# Patient Record
Sex: Female | Born: 1955 | Race: Black or African American | Hispanic: No | Marital: Married | Smoking: Never smoker
Health system: Southern US, Community
[De-identification: ages and names within clinical notes are randomized; demographics above are authoritative.]

## PROBLEM LIST (undated history)

## (undated) DIAGNOSIS — N62 Hypertrophy of breast: Secondary | ICD-10-CM

## (undated) DIAGNOSIS — E785 Hyperlipidemia, unspecified: Secondary | ICD-10-CM

## (undated) HISTORY — PX: BUNIONECTOMY: SHX129

## (undated) HISTORY — PX: KNEE SURGERY: SHX244

## (undated) HISTORY — PX: ABDOMINAL HYSTERECTOMY: SHX81

---

## 2008-10-04 ENCOUNTER — Encounter: Payer: Self-pay | Admitting: Obstetrics and Gynecology

## 2008-10-04 ENCOUNTER — Inpatient Hospital Stay (HOSPITAL_COMMUNITY): Admission: RE | Admit: 2008-10-04 | Discharge: 2008-10-05 | Payer: Self-pay | Admitting: Obstetrics and Gynecology

## 2010-02-19 ENCOUNTER — Encounter
Admission: RE | Admit: 2010-02-19 | Discharge: 2010-02-19 | Payer: Self-pay | Source: Home / Self Care | Attending: Family Medicine | Admitting: Family Medicine

## 2010-05-31 LAB — CBC
HCT: 30.4 % — ABNORMAL LOW (ref 36.0–46.0)
Hemoglobin: 9.8 g/dL — ABNORMAL LOW (ref 12.0–15.0)
MCHC: 32.5 g/dL (ref 30.0–36.0)
Platelets: 275 10*3/uL (ref 150–400)
RBC: 3.79 MIL/uL — ABNORMAL LOW (ref 3.87–5.11)
RDW: 17 % — ABNORMAL HIGH (ref 11.5–15.5)
WBC: 5.9 10*3/uL (ref 4.0–10.5)
WBC: 9.3 10*3/uL (ref 4.0–10.5)

## 2010-07-07 NOTE — Discharge Summary (Signed)
[name], Sheila Wilkinson                ACCOUNT NO.:  [id number]   MEDICAL RECORD NO.:  [id number]          PATIENT TYPE:  INP   LOCATION:  9305                          FACILITY:  [facility]   PHYSICIAN:  Arlyce Harman, MD     DATE OF BIRTH:  1956/02/06   DATE OF ADMISSION:  10/04/2008  DATE OF DISCHARGE:  10/05/2008                               DISCHARGE SUMMARY   REASON FOR HOSPITALIZATION:  This 55 year old parous female was admitted  for hysterectomy.   DIAGNOSES:  Menorrhagia, dysmenorrhea, anemia, and enlarged uterus with  fibroid growth.   Options for therapy have been thoroughly discussed with the patient and  conservative methods have been performed without relief.  Therefore,  hysterectomy was requested.  Informed consent was given and she  underwent a total vaginal hysterectomy on October 04, 2008.  Postoperative course has been uncomplicated, and she is being discharged  today in excellent condition.  Pathology report is pending at the time  of discharge.   LABORATORY DATA:  On October 05, 2008, her hemoglobin is 8.9 with  hematocrit of 27, WBC is 9.3, and platelet count is normal.   FINAL DIAGNOSES:  Menorrhagia, dysmenorrhea, fibroid tumors, and anemia.   SIGNIFICANT FINDINGS:  None.   PROCEDURES PERFORMED:  A total vaginal hysterectomy with morcellation.  Condition is excellent.  The patient is ambulatory and voiding without  difficulty.   DISCHARGE INSTRUCTIONS:  Instructions given to the patient are complete  rest over the next 2-3 weeks with ambulation inside the house.  She will  call if she has a fever or excessive pain or bleeding.  No sex for 6  weeks.  No heavy lifting for 6 weeks.   MEDICATIONS:  She has iron at home, she will continue to take 1 a day  and a multivitamin 1 a day.  A prescription is given for Naprosyn 500  mg, she is to take 1 tablet every 8 hours as needed for pain.  She is to  use over-the-counter stool softener everyday for the next week.   She is  to have a regular diet.      Arlyce Harman, MD  Electronically Signed     EG/MEDQ  D:  10/05/2008  T:  10/05/2008  Job:  [id number]

## 2010-07-07 NOTE — Op Note (Signed)
NAMESHAVONN, CONVEY                ACCOUNT NO.:  [id number]   MEDICAL RECORD NO.:  [id number]          PATIENT TYPE:  INP   LOCATION:  9305                          FACILITY:  [facility]   PHYSICIAN:  Arlyce Harman, MD     DATE OF BIRTH:  1955/03/19   DATE OF PROCEDURE:  10/04/2008  DATE OF DISCHARGE:                               OPERATIVE REPORT   PREOPERATIVE DIAGNOSES:  Enlarged uterus, fibroids, menorrhagia, anemia.   POSTOPERATIVE DIAGNOSES:  Enlarged uterus, fibroids, menorrhagia,  anemia.   SURGEON:  Arlyce Harman, MD   ASSISTANT:  None.   COMPLICATIONS:  None.   ESTIMATED BLOOD LOSS:  200 mL.   PROCEDURE:  Total vaginal hysterectomy with morcellation due to enlarged  uterus.   INDICATIONS AND CONSENT:  This patient is 55 years old, has had  worsening menorrhagia and dysmenorrhea and anemia and has not resolved  with conservative methods, and has requested hysterectomy.  The patient  is aware that she may have to have an abdominal hysterectomy due to her  enlarged uterus, but we will attempt vaginal hysterectomy.  Risks,  possible complications of the procedure has been explained and informed  consent was given.   Findings  enlarged enlarged   PROCEDURE IN DETAIL:  The patient was placed on the table in supine  position.  General anesthesia was induced.  She was then placed in the  dorsolithotomy position and a Foley was placed in the bladder.  Exam  under anesthesia revealed an enlarged uterus approximately 10 weeks size  and the adnexa was free and cervix is mobilized.  Therefore, we felt  that we could proceed with vaginal hysterectomy.  The cervix was entered  and lip of the cervix was grasped, and the posterior lip of the cervix  was grasped with Lahey tenaculum.  An approximately 6 mL solution of Neo-  Synephrine was injected paracervically.  The cervix was then incised and  the epithelium reflected upward.  Posteriorly, we entered the cul-de-sac  and then a  right angle retractor was placed in the cul-de-sac and the  uterosacral lips were clamped, cut, and tied, all ties with 1-0 Vicryl  suture.  These were done bilaterally and then we went anteriorly and  reflected the epithelium up and identified the peritoneum.  A tag was  placed on that and we continued to work away around laterally.  The  bladder pillows were clamped, cut and tied bilaterally and the bladder  was reflected upward.  Then, the cardinal ligaments were clamped, cut,  and tied bilaterally.  The uterine vessels were clamped, cut, and tied  bilaterally.   At this point, we tried to deliver this uterus, but could not due to a  large fundal fibroid posteriorly.  Therefore, we had to morcellate the  uterus and take it out in pieces until we were able to get the fundus  delivered.  The adnexal pedicles were clamped, cut, and tied  bilaterally.  These were doubly tied and released.  The uterus and  cervix were removed in total and handed off.  The pedicles were checked  and were dried.  Lateral stitches were placed in the cuff.  In the end  then, the peritoneal stitch was placed.  A McCall stitch was placed in  the posterior cul-de-sac.  At this point, the lateral stitches were tied  down bilaterally and cut.  The peritoneum was then tied down, and McCall  stitch was tied down in layers.  Hemostasis was excellent.  Therefore,  the vaginal cuff was closed using 1 Vicryl running stitch.  Hemostasis  was excellent.  The procedure was terminated.  The patient was awakened  and taken to recovery in good condition.    Specimen: enlarged uterus and cervix, uterus morcellated.   Specimen sent to pathology.      Arlyce Harman, MD  Electronically Signed     EG/MEDQ  D:  10/04/2008  T:  10/05/2008  Job:  [id number]

## 2013-10-06 ENCOUNTER — Encounter: Payer: Self-pay | Admitting: Emergency Medicine

## 2013-10-06 ENCOUNTER — Emergency Department (INDEPENDENT_AMBULATORY_CARE_PROVIDER_SITE_OTHER)
Admission: EM | Admit: 2013-10-06 | Discharge: 2013-10-06 | Disposition: A | Discharge status: Home / Self Care | Payer: BC Managed Care – PPO | Source: Home / Self Care | Attending: Family Medicine | Admitting: Family Medicine

## 2013-10-06 DIAGNOSIS — S0502XA Injury of conjunctiva and corneal abrasion without foreign body, left eye, initial encounter: Secondary | ICD-10-CM

## 2013-10-06 DIAGNOSIS — S058X9A Other injuries of unspecified eye and orbit, initial encounter: Secondary | ICD-10-CM

## 2013-10-06 HISTORY — DX: Hyperlipidemia, unspecified: E78.5

## 2013-10-06 MED ORDER — SULFACETAMIDE SODIUM 10 % OP SOLN: OPHTHALMIC | Status: DC

## 2013-10-06 MED ORDER — ERYTHROMYCIN 5 MG/GM OP OINT: TOPICAL_OINTMENT | OPHTHALMIC | Status: DC

## 2013-10-06 NOTE — ED Notes (Signed)
L eye scratchy when pt awoke this AM.  Worsening as day progresses.  Now painful.  Feels like sand in eye.

## 2013-10-06 NOTE — ED Provider Notes (Signed)
CSN: [id number]     Arrival date & time 10/06/13  1707 History   First MD Initiated Contact with Patient 10/06/13 1800     Chief Complaint  Patient presents with  . Eye Pain    Left      HPI Comments: Patient awoke this morning with a foreign body sensation and mild photophobia in her left eye.  She rinsed her eye but symptoms persisted.  Patient is a 58 y.o. female presenting with eye pain. The history is provided by the patient.  Eye Pain This is a new problem. Episode onset: this morning. The problem occurs constantly. The problem has not changed since onset.Pertinent negatives include no headaches. Exacerbated by: blinking. Nothing relieves the symptoms. Treatments tried: eye lavage. The treatment provided no relief.    Past Medical History  Diagnosis Date  . Hyperlipidemia    Past Surgical History  Procedure Laterality Date  . Abdominal hysterectomy    . Cesarean section    . Knee surgery     Family History  Problem Relation Age of Onset  . Hypertension Mother   . Diabetes Father    History  Substance Use Topics  . Smoking status: Never Smoker   . Smokeless tobacco: Never Used  . Alcohol Use: Yes     Comment: 3-5   OB History   Grav Para Term Preterm Abortions TAB SAB Ect Mult Living                 Review of Systems  Eyes: Positive for pain.  Neurological: Negative for headaches.  All other systems reviewed and are negative.   Allergies  Review of patient's allergies indicates no known allergies.  Home Medications   Prior to Admission medications   Medication Sig Start Date End Date Taking? Authorizing Provider  erythromycin ophthalmic ointment Place a 1/2 inch ribbon of ointment into the lower eyelid q3 to 4hr 10/06/13   Lattie HawStephen A Zabdi Mis, MD   BP 120/80  Pulse 65  Temp(Src) 98.4 F (36.9 C) (Oral)  Ht 5\' 5"  (1.651 m)  Wt 163 lb 12 oz (74.277 kg)  BMI 27.25 kg/m2  SpO2 98% Physical Exam  Nursing note and vitals reviewed. Constitutional: She  appears well-developed and well-nourished. No distress.  HENT:  Head: Atraumatic.  Right Ear: External ear normal.  Left Ear: External ear normal.  Nose: Nose normal.  Mouth/Throat: Oropharynx is clear and moist.  Eyes: EOM and lids are normal. Pupils are equal, round, and reactive to light. Lids are everted and swept, no foreign bodies found. Left eye exhibits no chemosis, no discharge, no exudate and no hordeolum. No foreign body present in the left eye. Left conjunctiva is injected. Left conjunctiva has no hemorrhage.    Mild left photophobia present.  Fundi negative.  Fluorescein to left eye reveals 2mm diameter superficial abrasion as noted on diagram.      ED Course  Procedures  none     MDM   1. Left corneal abrasion, initial encounter    Begin erythromycin ophthalmic ointment HS; sulfacetamide ophthalmic suspension daytime. Followup with ophthalmologist if not improved 3 days.    Lattie HawStephen A Viveca Beckstrom, MD 10/07/13 [phone]

## 2013-10-06 NOTE — Discharge Instructions (Signed)
Corneal Abrasion °The cornea is the clear covering at the front and center of the eye. When looking at the colored portion of the eye (iris), you are looking through the cornea. This very thin tissue is made up of many layers. The surface layer is a single layer of cells (corneal epithelium) and is one of the most sensitive tissues in the body. If a scratch or injury causes the corneal epithelium to come off, it is called a corneal abrasion. If the injury extends to the tissues below the epithelium, the condition is called a corneal ulcer. °CAUSES  °· Scratches. °· Trauma. °· Foreign body in the eye. °Some people have recurrences of abrasions in the area of the original injury even after it has healed (recurrent erosion syndrome). Recurrent erosion syndrome generally improves and goes away with time. °SYMPTOMS  °· Eye pain. °· Difficulty or inability to keep the injured eye open. °· The eye becomes very sensitive to light. °· Recurrent erosions tend to happen suddenly, first thing in the morning, usually after waking up and opening the eye. °DIAGNOSIS  °Your health care provider can diagnose a corneal abrasion during an eye exam. Dye is usually placed in the eye using a drop or a small paper strip moistened by your tears. When the eye is examined with a special light, the abrasion shows up clearly because of the dye. °TREATMENT  °· Small abrasions may be treated with antibiotic drops or ointment alone. °If the abrasion becomes infected and spreads to the deeper tissues of the cornea, a corneal ulcer can result. This is serious because it can cause corneal scarring. Corneal scars interfere with light passing through the cornea and cause a loss of vision in the involved eye. °HOME CARE INSTRUCTIONS °· Use medicine or ointment as directed. Only take over-the-counter or prescription medicines for pain, discomfort, or fever as directed by your health care provider. °· Do not drive or operate machinery if your eye is  patched. Your ability to judge distances is impaired. °· If your health care provider has given you a follow-up appointment, it is very important to keep that appointment. Not keeping the appointment could result in a severe eye infection or permanent loss of vision. If there is any problem keeping the appointment, let your health care provider know. °SEEK MEDICAL CARE IF:  °· You have pain, light sensitivity, and a scratchy feeling in one eye or both eyes. °· Any kind of discharge develops from the eye after treatment or if the lids stick together in the morning. °· You have the same symptoms in the morning as you did with the original abrasion days, weeks, or months after the abrasion healed. °MAKE SURE YOU:  °· Understand these instructions. °· Will watch your condition. °· Will get help right away if you are not doing well or get worse. °Document Released: 02/06/2000 Document Revised: 02/13/2013 Document Reviewed: 10/16/2012 °[employer]® Patient Information ©2015 [employer]. This information is not intended to replace advice given to you by your health care provider. Make sure you discuss any questions you have with your health care provider. ° °

## 2017-04-20 MED FILL — HYDROCODON-APAP 5-325: 5-325 | 3 days supply | Qty: 30 | Fill #0

## 2017-05-27 ENCOUNTER — Other Ambulatory Visit: Payer: Self-pay

## 2017-05-27 ENCOUNTER — Emergency Department (HOSPITAL_BASED_OUTPATIENT_CLINIC_OR_DEPARTMENT_OTHER)
Admission: EM | Admit: 2017-05-27 | Discharge: 2017-05-27 | Disposition: A | Discharge status: Home / Self Care | Payer: BLUE CROSS/BLUE SHIELD | Attending: Emergency Medicine | Admitting: Emergency Medicine

## 2017-05-27 ENCOUNTER — Emergency Department (HOSPITAL_BASED_OUTPATIENT_CLINIC_OR_DEPARTMENT_OTHER): Payer: BLUE CROSS/BLUE SHIELD

## 2017-05-27 ENCOUNTER — Encounter (HOSPITAL_BASED_OUTPATIENT_CLINIC_OR_DEPARTMENT_OTHER): Payer: Self-pay | Admitting: *Deleted

## 2017-05-27 DIAGNOSIS — R519 Headache, unspecified: Secondary | ICD-10-CM

## 2017-05-27 DIAGNOSIS — G501 Atypical facial pain: Secondary | ICD-10-CM | POA: Diagnosis not present

## 2017-05-27 DIAGNOSIS — R51 Headache: Secondary | ICD-10-CM

## 2017-05-27 DIAGNOSIS — R6884 Jaw pain: Secondary | ICD-10-CM | POA: Diagnosis present

## 2017-05-27 LAB — BASIC METABOLIC PANEL
Anion gap: 9 (ref 5–15)
BUN: 12 mg/dL (ref 6–20)
CALCIUM: 9.2 mg/dL (ref 8.9–10.3)
CHLORIDE: 106 mmol/L (ref 101–111)
CO2: 25 mmol/L (ref 22–32)
Creatinine, Ser: 0.71 mg/dL (ref 0.44–1.00)
GFR calc Af Amer: >60 mL/min (ref >60)
GFR calc non Af Amer: >60 mL/min (ref >60)
GLUCOSE: 102 mg/dL — AB (ref 65–99)
POTASSIUM: 3.5 mmol/L (ref 3.5–5.1)
Sodium: 140 mmol/L (ref 135–145)

## 2017-05-27 LAB — TROPONIN I: Troponin I: <0.03 ng/mL (ref <0.03)

## 2017-05-27 LAB — CBC
HEMATOCRIT: 37.7 % (ref 36.0–46.0)
HEMOGLOBIN: 12.1 g/dL (ref 12.0–15.0)
MCH: 29.5 pg (ref 26.0–34.0)
MCHC: 32.1 g/dL (ref 30.0–36.0)
MCV: 92 fL (ref 78.0–100.0)
Platelets: 207 10*3/uL (ref 150–400)
RBC: 4.1 MIL/uL (ref 3.87–5.11)
RDW: 13.8 % (ref 11.5–15.5)
WBC: 5.7 10*3/uL (ref 4.0–10.5)

## 2017-05-27 NOTE — ED Provider Notes (Signed)
[facility] EMERGENCY DEPARTMENT Provider Note   CSN: [id number] Arrival date & time: 05/27/17  1230     History   Chief Complaint Chief Complaint  Patient presents with  . Neck Pain  . Jaw Pain    HPI Sheila Wilkinson is a 62 y.o. female.  Patient with history of hyperlipidemia presents today with complaint of intermittent right-sided jaw pain with movement into the right shoulder.  Patient states the last 2 nights she developed the symptoms before going to bed.  She took ibuprofen which helped her symptoms and she was able to sleep well.  This morning at about 10 AM while patient was at work, she developed right sided jaw pain and shoulder pain.  This resolved by arrival but she still states she has some soreness in this area.  She had no associated shortness of breath, diaphoresis, nausea or vomiting.  Patient does not exercise regularly but is active and has never had pains or symptoms like this provoked with exercise.  She denies any facial swelling.  She states that the pain felt like a throbbing associated with her teeth on the upper jaw on the right.  She does not think that she has a toothache however.  She is concerned about having a heart attack.  She denies history of hypertension, diabetes, smoking.  Mother had a heart attack in her late 5360s.  No lower extremity swelling.  Recent foot surgery but patient has been ambulatory.  No history of blood clots.     Past Medical History:  Diagnosis Date  . Hyperlipidemia     There are no active problems to display for this patient.   Past Surgical History:  Procedure Laterality Date  . ABDOMINAL HYSTERECTOMY    . BUNIONECTOMY    . CESAREAN SECTION    . KNEE SURGERY       OB History   None      Home Medications    Prior to Admission medications   Medication Sig Start Date End Date Taking? Authorizing Provider  erythromycin ophthalmic ointment Place a 1/2 inch ribbon of ointment into the lower eyelid q3 to  4hr 10/06/13   Lattie HawBeese, Stephen A, MD  sulfacetamide (BLEPH-10) 10 % ophthalmic solution Place 1 to 2 drops in the left eye every 2 to 3 hours daytime. 10/06/13   Lattie HawBeese, Stephen A, MD    Family History Family History  Problem Relation Age of Onset  . Hypertension Mother   . Diabetes Father     Social History Social History   Tobacco Use  . Smoking status: Never Smoker  . Smokeless tobacco: Never Used  Substance Use Topics  . Alcohol use: Yes    Comment: 3-5  . Drug use: No     Allergies   Patient has no known allergies.   Review of Systems Review of Systems  Constitutional: Negative for diaphoresis and fever.  HENT:       Facial pain  Eyes: Negative for redness.  Respiratory: Negative for cough and shortness of breath.   Cardiovascular: Negative for chest pain, palpitations and leg swelling.  Gastrointestinal: Negative for abdominal pain, nausea and vomiting.  Genitourinary: Negative for dysuria.  Musculoskeletal: Positive for myalgias (R shoulder). Negative for back pain and neck pain.  Skin: Negative for rash.  Neurological: Negative for syncope and light-headedness.  Psychiatric/Behavioral: The patient is not nervous/anxious.      Physical Exam Updated Vital Signs BP (!) 147/77   Pulse 61  Temp 98.6 F (37 C) (Oral)   Resp 18   Ht 5' 4.5" (1.638 m)   Wt 78.5 kg (173 lb)   SpO2 100%   BMI 29.24 kg/m   Physical Exam  Constitutional: She appears well-developed and well-nourished.  HENT:  Head: Normocephalic and atraumatic.  Mouth/Throat: Mucous membranes are normal. Mucous membranes are not dry.  No gross dental abscess, gum swelling, or findings to explain patient's facial pain.  Eyes: Conjunctivae are normal.  Neck: Trachea normal and normal range of motion. Neck supple. Normal carotid pulses and no JVD present. No muscular tenderness present. Carotid bruit is not present. No tracheal deviation present.  Normal carotid pulses, no bruit.    Cardiovascular: Normal rate, regular rhythm, S1 normal, S2 normal, normal heart sounds and intact distal pulses. Exam reveals no decreased pulses.  No murmur heard. Pulmonary/Chest: Effort normal. No respiratory distress. She has no wheezes. She exhibits no tenderness.  Abdominal: Soft. Normal aorta and bowel sounds are normal. There is no tenderness. There is no rebound and no guarding.  Musculoskeletal: Normal range of motion.  Neurological: She is alert.  Skin: Skin is warm and dry. She is not diaphoretic. No cyanosis. No pallor.  Psychiatric: She has a normal mood and affect.  Nursing note and vitals reviewed.    ED Treatments / Results  Labs (all labs ordered are listed, but only abnormal results are displayed) Labs Reviewed  CBC  BASIC METABOLIC PANEL  TROPONIN I    EKG EKG Interpretation  Date/Time:  Friday May 27 2017 12:46:21 EDT Ventricular Rate:  55 PR Interval:    QRS Duration: 87 QT Interval:  431 QTC Calculation: 413 R Axis:   48 Text Interpretation:  Sinus rhythm No previous ECGs available Artifact Confirmed by Vanetta Mulders [phone]) on 05/27/2017 12:55:11 PM   Radiology No results found.  Procedures Procedures (including critical care time)  Medications Ordered in ED Medications - No data to display   Initial Impression / Assessment and Plan / ED Course  I have reviewed the triage vital signs and the nursing notes.  Pertinent labs & imaging results that were available during my care of the patient were reviewed by me and considered in my medical decision making (see chart for details).     Patient seen and examined.  EKG reviewed.  This patient is concerned that she is having a cardiac event, will check labs and a chest x-ray.  Discussed with Dr. Deretha Emory. HEART=2. Pain is atypical. Do not suspect dissection in neck. No neuro changes.   Vital signs reviewed and are as follows: BP (!) 147/77   Pulse 61   Temp 98.6 F (37 C) (Oral)   Resp  18   Ht 5' 4.5" (1.638 m)   Wt 78.5 kg (173 lb)   SpO2 100%   BMI 29.24 kg/m   2:11 PM Workup is reassuring.  Discussed results with patient.  Heart score is 2.  Lab work and EKG today is reassuring.  Feel patient is low risk for jaw and shoulder pain representing in general equivalent.  Symptoms are not reproduced with exercise.  Feel comfortable with discharged home at this time.  Patient does have some risk factors and she is encouraged to follow-up with her primary care physician for continued risk factor medication and consideration of stress testing.  2:12 PM Patient was counseled to return with severe chest pain, especially if the pain is crushing or pressure-like and spreads to the arms, back, neck, or  jaw, or if they have sweating, nausea, or shortness of breath with the pain.  They were also told to return if their chest pain gets worse and does not go away with rest, they have an attack of chest pain lasting longer than usual despite rest and treatment with the medications their caregiver has prescribed, if they wake from sleep with chest pain or shortness of breath, if they feel dizzy or faint, if they have chest pain not typical of their usual pain, or if they have any other emergent concerns regarding their health.  The patient verbalized understanding and agreed.    Final Clinical Impressions(s) / ED Diagnoses   Final diagnoses:  Facial pain   Patient with facial/jaw pain and shoulder pain today.  Patient was concerned that she was having a heart attack.  She is low risk at baseline with HEART=2. EKG normal. Troponin neg x 1. Symptoms resolved. Pain is atypical. Normal vitals. Symptoms not suggestive of PE, dissection.  Return instructions as above.  Patient seems reliable to return with worsening.  ED Discharge Orders    None       Renne Crigler, PA-C 05/27/17 1414    Vanetta Mulders, MD 05/27/17 1520

## 2017-05-27 NOTE — Discharge Instructions (Signed)
Please read and follow all provided instructions.  Your diagnoses today include:  1. Facial pain    Tests performed today include:  An EKG of your heart  A chest x-ray  Cardiac enzymes - a blood test for heart muscle damage  Blood counts and electrolytes  Vital signs. See below for your results today.   Medications prescribed:   None  Take any prescribed medications only as directed.  Follow-up instructions: Please follow-up with your primary care provider for further evaluation of your symptoms.   Return instructions:  SEEK IMMEDIATE MEDICAL ATTENTION IF:  You have severe chest pain, especially if the pain is crushing or pressure-like and spreads to the arms, back, neck, or jaw, or if you have sweating, nausea (feeling sick to your stomach), or shortness of breath. THIS IS AN EMERGENCY. Don't wait to see if the pain will go away. Get medical help at once. Call 911 or 0 (operator). DO NOT drive yourself to the hospital.   Your chest pain gets worse and does not go away with rest.   You have an attack of chest pain lasting longer than usual, despite rest and treatment with the medications your caregiver has prescribed.   You wake from sleep with chest pain or shortness of breath.  You feel dizzy or faint.  You have chest pain not typical of your usual pain for which you originally saw your caregiver.   You have any other emergent concerns regarding your health.  Additional Information: Chest pain comes from many different causes. Your caregiver has diagnosed you as having chest pain that is not specific for one problem, but does not require admission.  You are at low risk for an acute heart condition or other serious illness.   Your vital signs today were: BP (!) 147/77    Pulse 61    Temp 98.6 F (37 C) (Oral)    Resp 18    Ht 5' 4.5" (1.638 m)    Wt 78.5 kg (173 lb)    SpO2 100%    BMI 29.24 kg/m  If your blood pressure (BP) was elevated above 135/85 this visit,  please have this repeated by your doctor within one month. --------------

## 2017-05-27 NOTE — ED Triage Notes (Signed)
Jaw and neck pain x 2 days. Pain is only at night. Today while at work she had some pain in her right neck and shoulder.

## 2017-05-27 NOTE — ED Notes (Signed)
Patient transported to X-ray 

## 2017-08-10 ENCOUNTER — Encounter (HOSPITAL_BASED_OUTPATIENT_CLINIC_OR_DEPARTMENT_OTHER): Payer: Self-pay | Admitting: *Deleted

## 2017-08-10 ENCOUNTER — Other Ambulatory Visit: Payer: Self-pay

## 2017-08-12 ENCOUNTER — Ambulatory Visit: Payer: Self-pay | Admitting: Plastic Surgery

## 2017-08-16 ENCOUNTER — Other Ambulatory Visit: Payer: Self-pay

## 2017-08-16 ENCOUNTER — Ambulatory Visit (HOSPITAL_BASED_OUTPATIENT_CLINIC_OR_DEPARTMENT_OTHER): Payer: BLUE CROSS/BLUE SHIELD | Admitting: Certified Registered"

## 2017-08-16 ENCOUNTER — Encounter (HOSPITAL_BASED_OUTPATIENT_CLINIC_OR_DEPARTMENT_OTHER): Payer: Self-pay | Admitting: Certified Registered"

## 2017-08-16 ENCOUNTER — Encounter (HOSPITAL_BASED_OUTPATIENT_CLINIC_OR_DEPARTMENT_OTHER)
Admission: RE | Disposition: A | Discharge status: Home / Self Care | Payer: Self-pay | Source: Ambulatory Visit | Attending: Plastic Surgery

## 2017-08-16 ENCOUNTER — Ambulatory Visit (HOSPITAL_BASED_OUTPATIENT_CLINIC_OR_DEPARTMENT_OTHER)
Admission: RE | Admit: 2017-08-16 | Discharge: 2017-08-16 | Disposition: A | Discharge status: Home / Self Care | Payer: BLUE CROSS/BLUE SHIELD | Source: Ambulatory Visit | Attending: Plastic Surgery | Admitting: Plastic Surgery

## 2017-08-16 DIAGNOSIS — N62 Hypertrophy of breast: Secondary | ICD-10-CM | POA: Insufficient documentation

## 2017-08-16 DIAGNOSIS — Z90710 Acquired absence of both cervix and uterus: Secondary | ICD-10-CM | POA: Insufficient documentation

## 2017-08-16 DIAGNOSIS — E785 Hyperlipidemia, unspecified: Secondary | ICD-10-CM | POA: Insufficient documentation

## 2017-08-16 DIAGNOSIS — E7800 Pure hypercholesterolemia, unspecified: Secondary | ICD-10-CM | POA: Diagnosis not present

## 2017-08-16 HISTORY — PX: BREAST REDUCTION SURGERY: SHX8

## 2017-08-16 HISTORY — DX: Hypertrophy of breast: N62

## 2017-08-16 SURGERY — MAMMOPLASTY, REDUCTION
Anesthesia: General | Site: Breast | Laterality: Bilateral

## 2017-08-16 MED ORDER — FENTANYL CITRATE (PF) 100 MCG/2ML IJ SOLN
INTRAMUSCULAR | Status: AC
  Filled 2017-08-16: qty 2

## 2017-08-16 MED ORDER — DEXAMETHASONE SODIUM PHOSPHATE 10 MG/ML IJ SOLN
INTRAMUSCULAR | Status: AC
  Filled 2017-08-16: qty 1

## 2017-08-16 MED ORDER — SCOPOLAMINE 1 MG/3DAYS TD PT72: 1.0000 | MEDICATED_PATCH | Freq: Once | TRANSDERMAL | Status: DC | PRN

## 2017-08-16 MED ORDER — CEFAZOLIN SODIUM-DEXTROSE 2-4 GM/100ML-% IV SOLN
2.0000 g | INTRAVENOUS | Status: AC
  Administered 2017-08-16: 2 g via INTRAVENOUS

## 2017-08-16 MED ORDER — DEXAMETHASONE SODIUM PHOSPHATE 4 MG/ML IJ SOLN
INTRAMUSCULAR | Status: DC | PRN
  Administered 2017-08-16: 10 mg via INTRAVENOUS

## 2017-08-16 MED ORDER — BACITRACIN ZINC 500 UNIT/GM EX OINT
TOPICAL_OINTMENT | CUTANEOUS | Status: DC | PRN
  Administered 2017-08-16: 1 via TOPICAL

## 2017-08-16 MED ORDER — OXYCODONE HCL 5 MG PO TABS: 5.0000 mg | ORAL_TABLET | Freq: Once | ORAL | Status: DC | PRN

## 2017-08-16 MED ORDER — PROMETHAZINE HCL 25 MG/ML IJ SOLN: 6.2500 mg | INTRAMUSCULAR | Status: DC | PRN

## 2017-08-16 MED ORDER — LIDOCAINE-EPINEPHRINE 1 %-1:100000 IJ SOLN
INTRAMUSCULAR | Status: AC
  Filled 2017-08-16: qty 1

## 2017-08-16 MED ORDER — BACITRACIN ZINC 500 UNIT/GM EX OINT
TOPICAL_OINTMENT | CUTANEOUS | Status: AC
  Filled 2017-08-16: qty 28.35

## 2017-08-16 MED ORDER — BUPIVACAINE HCL (PF) 0.5 % IJ SOLN
INTRAMUSCULAR | Status: AC
  Filled 2017-08-16: qty 30

## 2017-08-16 MED ORDER — MIDAZOLAM HCL 2 MG/2ML IJ SOLN
INTRAMUSCULAR | Status: AC
  Filled 2017-08-16: qty 2

## 2017-08-16 MED ORDER — SODIUM CHLORIDE 0.9 % IJ SOLN
INTRAMUSCULAR | Status: DC | PRN
  Administered 2017-08-16: 60 mL

## 2017-08-16 MED ORDER — LACTATED RINGERS IV SOLN
INTRAVENOUS | Status: DC
  Administered 2017-08-16 (×3): via INTRAVENOUS

## 2017-08-16 MED ORDER — ONDANSETRON HCL 4 MG/2ML IJ SOLN
INTRAMUSCULAR | Status: AC
  Filled 2017-08-16: qty 4

## 2017-08-16 MED ORDER — BUPIVACAINE HCL (PF) 0.25 % IJ SOLN
INTRAMUSCULAR | Status: AC
  Filled 2017-08-16: qty 30

## 2017-08-16 MED ORDER — BUPIVACAINE LIPOSOME 1.3 % IJ SUSP
INTRAMUSCULAR | Status: DC | PRN
  Administered 2017-08-16: 50 mL

## 2017-08-16 MED ORDER — PROPOFOL 500 MG/50ML IV EMUL
INTRAVENOUS | Status: DC | PRN
  Administered 2017-08-16: 50 ug/kg/min via INTRAVENOUS

## 2017-08-16 MED ORDER — ONDANSETRON HCL 4 MG/2ML IJ SOLN
INTRAMUSCULAR | Status: DC | PRN
  Administered 2017-08-16: 4 mg via INTRAVENOUS

## 2017-08-16 MED ORDER — CEFAZOLIN SODIUM-DEXTROSE 2-4 GM/100ML-% IV SOLN
INTRAVENOUS | Status: AC
  Filled 2017-08-16: qty 100

## 2017-08-16 MED ORDER — PROPOFOL 500 MG/50ML IV EMUL
INTRAVENOUS | Status: AC
  Filled 2017-08-16: qty 100

## 2017-08-16 MED ORDER — ROCURONIUM BROMIDE 100 MG/10ML IV SOLN
INTRAVENOUS | Status: DC | PRN
  Administered 2017-08-16: 50 mg via INTRAVENOUS

## 2017-08-16 MED ORDER — OXYCODONE HCL 5 MG/5ML PO SOLN: 5.0000 mg | Freq: Once | ORAL | Status: DC | PRN

## 2017-08-16 MED ORDER — PROPOFOL 10 MG/ML IV BOLUS
INTRAVENOUS | Status: DC | PRN
  Administered 2017-08-16: 200 mg via INTRAVENOUS

## 2017-08-16 MED ORDER — MIDAZOLAM HCL 2 MG/2ML IJ SOLN
1.0000 mg | INTRAMUSCULAR | Status: DC | PRN
  Administered 2017-08-16: 2 mg via INTRAVENOUS

## 2017-08-16 MED ORDER — FENTANYL CITRATE (PF) 100 MCG/2ML IJ SOLN
50.0000 ug | INTRAMUSCULAR | Status: AC | PRN
  Administered 2017-08-16 (×4): 50 ug via INTRAVENOUS

## 2017-08-16 MED ORDER — CHLORHEXIDINE GLUCONATE CLOTH 2 % EX PADS: 6.0000 | MEDICATED_PAD | Freq: Once | CUTANEOUS | Status: DC

## 2017-08-16 MED ORDER — FENTANYL CITRATE (PF) 100 MCG/2ML IJ SOLN
25.0000 ug | INTRAMUSCULAR | Status: DC | PRN
  Administered 2017-08-16: 25 ug via INTRAVENOUS
  Administered 2017-08-16: 50 ug via INTRAVENOUS

## 2017-08-16 MED ORDER — SUGAMMADEX SODIUM 500 MG/5ML IV SOLN
INTRAVENOUS | Status: AC
  Filled 2017-08-16: qty 5

## 2017-08-16 MED ORDER — SUGAMMADEX SODIUM 200 MG/2ML IV SOLN
INTRAVENOUS | Status: DC | PRN
  Administered 2017-08-16: 200 mg via INTRAVENOUS

## 2017-08-16 MED ORDER — LIDOCAINE HCL (CARDIAC) PF 100 MG/5ML IV SOSY
PREFILLED_SYRINGE | INTRAVENOUS | Status: AC
  Filled 2017-08-16: qty 10

## 2017-08-16 MED ORDER — SODIUM CHLORIDE 0.9 % IJ SOLN
INTRAMUSCULAR | Status: AC
  Filled 2017-08-16: qty 20

## 2017-08-16 MED ORDER — BUPIVACAINE LIPOSOME 1.3 % IJ SUSP
INTRAMUSCULAR | Status: AC
  Filled 2017-08-16: qty 20

## 2017-08-16 MED ORDER — ROCURONIUM BROMIDE 10 MG/ML (PF) SYRINGE
PREFILLED_SYRINGE | INTRAVENOUS | Status: AC
  Filled 2017-08-16: qty 10

## 2017-08-16 MED ORDER — LIDOCAINE HCL (CARDIAC) PF 100 MG/5ML IV SOSY
PREFILLED_SYRINGE | INTRAVENOUS | Status: DC | PRN
  Administered 2017-08-16: 60 mg via INTRAVENOUS

## 2017-08-16 SURGICAL SUPPLY — 70 items
BAG DECANTER FOR FLEXI CONT (MISCELLANEOUS) ×3 IMPLANT
BENZOIN TINCTURE PRP APPL 2/3 (GAUZE/BANDAGES/DRESSINGS) ×6 IMPLANT
BLADE KNIFE PERSONA 10 (BLADE) ×12 IMPLANT
BLADE KNIFE PERSONA 15 (BLADE) ×9 IMPLANT
BNDG GAUZE ELAST 4 BULKY (GAUZE/BANDAGES/DRESSINGS) ×6 IMPLANT
CANISTER SUCT 1200ML W/VALVE (MISCELLANEOUS) ×3 IMPLANT
CAP BOUFFANT 24 BLUE NURSES (PROTECTIVE WEAR) ×3 IMPLANT
CLOSURE WOUND 1/2 X4 (GAUZE/BANDAGES/DRESSINGS) ×4
COVER BACK TABLE 60X90IN (DRAPES) ×3 IMPLANT
COVER MAYO STAND STRL (DRAPES) ×3 IMPLANT
DECANTER SPIKE VIAL GLASS SM (MISCELLANEOUS) ×2 IMPLANT
DRAIN CHANNEL 10F 3/8 F FF (DRAIN) ×6 IMPLANT
DRAPE LAPAROSCOPIC ABDOMINAL (DRAPES) ×1 IMPLANT
DRAPE U-SHAPE 76X120 STRL (DRAPES) ×4 IMPLANT
DRSG EMULSION OIL 3X3 NADH (GAUZE/BANDAGES/DRESSINGS) ×6 IMPLANT
DRSG PAD ABDOMINAL 8X10 ST (GAUZE/BANDAGES/DRESSINGS) ×6 IMPLANT
ELECT REM PT RETURN 9FT ADLT (ELECTROSURGICAL) ×3
ELECTRODE REM PT RTRN 9FT ADLT (ELECTROSURGICAL) ×1 IMPLANT
EVACUATOR SILICONE 100CC (DRAIN) ×6 IMPLANT
FILTER 7/8 IN (FILTER) IMPLANT
GAUZE SPONGE 4X4 12PLY STRL (GAUZE/BANDAGES/DRESSINGS) ×6 IMPLANT
GLOVE BIO SURGEON STRL SZ 6.5 (GLOVE) ×1 IMPLANT
GLOVE BIO SURGEON STRL SZ7 (GLOVE) ×7 IMPLANT
GLOVE BIO SURGEONS STRL SZ 6.5 (GLOVE) ×1
GLOVE BIOGEL PI IND STRL 6.5 (GLOVE) IMPLANT
GLOVE BIOGEL PI IND STRL 7.0 (GLOVE) IMPLANT
GLOVE BIOGEL PI INDICATOR 6.5 (GLOVE) ×2
GLOVE BIOGEL PI INDICATOR 7.0 (GLOVE) ×4
GLOVE ECLIPSE 6.5 STRL STRAW (GLOVE) ×4 IMPLANT
GOWN STRL REUS W/ TWL LRG LVL3 (GOWN DISPOSABLE) ×2 IMPLANT
GOWN STRL REUS W/TWL LRG LVL3 (GOWN DISPOSABLE) ×8
IV NS 250ML (IV SOLUTION) ×2
IV NS 250ML BAXH (IV SOLUTION) ×1 IMPLANT
NDL HYPO 25X1 1.5 SAFETY (NEEDLE) ×3 IMPLANT
NDL SAFETY ECLIPSE 18X1.5 (NEEDLE) ×1 IMPLANT
NDL SPNL 18GX3.5 QUINCKE PK (NEEDLE) ×1 IMPLANT
NEEDLE HYPO 18GX1.5 SHARP (NEEDLE)
NEEDLE HYPO 25X1 1.5 SAFETY (NEEDLE) ×6 IMPLANT
NEEDLE SPNL 18GX3.5 QUINCKE PK (NEEDLE) ×3 IMPLANT
NS IRRIG 1000ML POUR BTL (IV SOLUTION) ×6 IMPLANT
PACK BASIN DAY SURGERY FS (CUSTOM PROCEDURE TRAY) ×3 IMPLANT
PIN SAFETY STERILE (MISCELLANEOUS) ×3 IMPLANT
SCRUB TECHNI CARE 4 OZ NO DYE (MISCELLANEOUS) ×3 IMPLANT
SLEEVE SCD COMPRESS KNEE MED (MISCELLANEOUS) ×3 IMPLANT
SPECIMEN JAR MEDIUM (MISCELLANEOUS) ×2 IMPLANT
SPECIMEN JAR X LARGE (MISCELLANEOUS) ×4 IMPLANT
SPONGE LAP 18X18 RF (DISPOSABLE) ×9 IMPLANT
STAPLER VISISTAT 35W (STAPLE) ×6 IMPLANT
STRIP CLOSURE SKIN 1/2X4 (GAUZE/BANDAGES/DRESSINGS) ×8 IMPLANT
SUT ETHILON 3 0 PS 1 (SUTURE) ×3 IMPLANT
SUT MNCRL AB 3-0 PS2 18 (SUTURE) ×8 IMPLANT
SUT MNCRL AB 4-0 PS2 18 (SUTURE) ×8 IMPLANT
SUT MON AB 5-0 PS2 18 (SUTURE) ×8 IMPLANT
SUT PROLENE 2 0 CT2 30 (SUTURE) ×3 IMPLANT
SUT PROLENE 3 0 PS 1 (SUTURE) ×6 IMPLANT
SUT VLOC 90 P-14 23 (SUTURE) ×6 IMPLANT
SYR BULB IRRIGATION 50ML (SYRINGE) ×6 IMPLANT
SYR CONTROL 10ML LL (SYRINGE) ×6 IMPLANT
TAPE MEASURE VINYL STERILE (MISCELLANEOUS) ×3 IMPLANT
TOWEL GREEN STERILE FF (TOWEL DISPOSABLE) ×9 IMPLANT
TOWEL OR NON WOVEN STRL DISP B (DISPOSABLE) IMPLANT
TRAY DSU PREP LF (CUSTOM PROCEDURE TRAY) ×3 IMPLANT
TRAY FOL W/BAG SLVR 16FR STRL (SET/KITS/TRAYS/PACK) ×1 IMPLANT
TRAY FOLEY W/BAG SLVR 14FR LF (SET/KITS/TRAYS/PACK) ×2 IMPLANT
TRAY FOLEY W/BAG SLVR 16FR LF (SET/KITS/TRAYS/PACK)
TUBE CONNECTING 20'X1/4 (TUBING) ×1
TUBE CONNECTING 20X1/4 (TUBING) ×2 IMPLANT
UNDERPAD 30X30 (UNDERPADS AND DIAPERS) ×6 IMPLANT
VAC PENCILS W/TUBING CLEAR (MISCELLANEOUS) ×3 IMPLANT
YANKAUER SUCT BULB TIP NO VENT (SUCTIONS) ×3 IMPLANT

## 2017-08-16 NOTE — Anesthesia Postprocedure Evaluation (Signed)
Anesthesia Post Note  Patient: Ishanvi Q Mattson  Procedure(s) Performed: MAMMARY REDUCTION  (BREAST) BILATERAL (Bilateral Breast)     Patient location during evaluation: PACU Anesthesia Type: General Level of consciousness: awake and alert Pain management: pain level controlled Vital Signs Assessment: post-procedure vital signs reviewed and stable Respiratory status: spontaneous breathing, nonlabored ventilation and respiratory function stable Cardiovascular status: blood pressure returned to baseline and stable Postop Assessment: no apparent nausea or vomiting Anesthetic complications: no    Last Vitals:  Vitals:   08/16/17 1315 08/16/17 1330  BP: 140/85 138/79  Pulse: 72 70  Resp: 16 14  Temp:    SpO2: 99% 99%    Last Pain:  Vitals:   08/16/17 1341  TempSrc:   PainSc: 6                  Beryle Lathehomas E Wilmar Prabhakar

## 2017-08-16 NOTE — Anesthesia Preprocedure Evaluation (Addendum)
Anesthesia Evaluation  Patient identified by MRN, date of birth, ID band Patient awake    Reviewed: Allergy & Precautions, NPO status , Patient's Chart, lab work & pertinent test results  Airway Mallampati: II  TM Distance: >3 FB Neck ROM: Full    Dental  (+) Dental Advisory Given, Teeth Intact   Pulmonary neg pulmonary ROS,    breath sounds clear to auscultation       Cardiovascular  Rhythm:Regular Rate:Normal  Hyperlipidemia   Neuro/Psych negative neurological ROS  negative psych ROS   GI/Hepatic negative GI ROS, Neg liver ROS,   Endo/Other  negative endocrine ROS  Renal/GU negative Renal ROS  negative genitourinary   Musculoskeletal negative musculoskeletal ROS (+)   Abdominal   Peds  Hematology negative hematology ROS (+)   Anesthesia Other Findings   Reproductive/Obstetrics                            Anesthesia Physical Anesthesia Plan  ASA: II  Anesthesia Plan: General   Post-op Pain Management:    Induction: Intravenous  PONV Risk Score and Plan: 3 and Treatment may vary due to age or medical condition, Ondansetron, Dexamethasone, Midazolam and Scopolamine patch - Pre-op  Airway Management Planned: Oral ETT  Additional Equipment: None  Intra-op Plan:   Post-operative Plan: Extubation in OR  Informed Consent: I have reviewed the patients History and Physical, chart, labs and discussed the procedure including the risks, benefits and alternatives for the proposed anesthesia with the patient or authorized representative who has indicated his/her understanding and acceptance.   Dental advisory given  Plan Discussed with: CRNA and Anesthesiologist  Anesthesia Plan Comments:         Anesthesia Quick Evaluation

## 2017-08-16 NOTE — Discharge Instructions (Signed)
1. No lifting greater than 5 lbs with arms for 4 weeks. 2. Empty, strip, record and reactivate JP drains 3 times a day. 3. Percocet 5/325 mg tabs 1-2 tabs po q 4-6 hours prn pain- prescription given in office. 4. Duricef 1 tab po bid- prescription given in office. 5. Sterapred dose pack as directed- prescription given in office. 6. Follow-up appointment Friday in office.    Post Anesthesia Home Care Instructions  Activity: Get plenty of rest for the remainder of the day. A responsible individual must stay with you for 24 hours following the procedure.  For the next 24 hours, DO NOT: -Drive a car -Advertising copywriterperate machinery -Drink alcoholic beverages -Take any medication unless instructed by your physician -Make any legal decisions or sign important papers.  Meals: Start with liquid foods such as gelatin or soup. Progress to regular foods as tolerated. Avoid greasy, spicy, heavy foods. If nausea and/or vomiting occur, drink only clear liquids until the nausea and/or vomiting subsides. Call your physician if vomiting continues.  Special Instructions/Symptoms: Your throat may feel dry or sore from the anesthesia or the breathing tube placed in your throat during surgery. If this causes discomfort, gargle with warm salt water. The discomfort should disappear within 24 hours.  If you had a scopolamine patch placed behind your ear for the management of post- operative nausea and/or vomiting:  1. The medication in the patch is effective for 72 hours, after which it should be removed.  Wrap patch in a tissue and discard in the trash. Wash hands thoroughly with soap and water. 2. You may remove the patch earlier than 72 hours if you experience unpleasant side effects which may include dry mouth, dizziness or visual disturbances. 3. Avoid touching the patch. Wash your hands with soap and water after contact with the patch.  About my Jackson-Pratt Bulb Drain  What is a Jackson-Pratt bulb? A  Jackson-Pratt is a soft, round device used to collect drainage. It is connected to a long, thin drainage catheter, which is held in place by one or two small stiches near your surgical incision site. When the bulb is squeezed, it forms a vacuum, forcing the drainage to empty into the bulb.  Emptying the Jackson-Pratt bulb- To empty the bulb: 1. Release the plug on the top of the bulb. 2. Pour the bulb's contents into a measuring container which your nurse will provide. 3. Record the time emptied and amount of drainage. Empty the drain(s) as often as your     doctor or nurse recommends.  Date                  Time                    Amount (Drain 1)                 Amount (Drain 2)  _____________________________________________________________________  _____________________________________________________________________  _____________________________________________________________________  _____________________________________________________________________  _____________________________________________________________________  _____________________________________________________________________  _____________________________________________________________________  _____________________________________________________________________  Squeezing the Jackson-Pratt Bulb- To squeeze the bulb: 1. Make sure the plug at the top of the bulb is open. 2. Squeeze the bulb tightly in your fist. You will hear air squeezing from the bulb. 3. Replace the plug while the bulb is squeezed. 4. Use a safety pin to attach the bulb to your clothing. This will keep the catheter from     pulling at the bulb insertion site.  When to call your doctor- Call your doctor if:  Drain site  Call your doctor if: °· Drain site becomes red, swollen or hot. °· You have a fever greater than 101 degrees F. °· There is oozing at the drain site. °· Drain falls out (apply a guaze bandage over the drain hole and secure it with  tape). °· Drainage increases daily not related to activity patterns. (You will usually have more drainage when you are active than when you are resting.) °· Drainage has a bad odor. ° ° ° °

## 2017-08-16 NOTE — Op Note (Signed)
OPERATIVE REPORT  08/16/2017  Sheila Wilkinson  PREOPERATIVE DIAGNOSIS:  Bilateral macromastia.  POSTOPERATIVE DIAGNOSIS:  Bilateral macromastia.  PROCEDURE:  Bilateral reduction mammoplasties.  ATTENDING SURGEON:  Eloise LevelsMary Ann Contogiannis, MD  ANESTHESIA:  General.  ANESTHESIOLOGIST:  , MD  COMPLICATIONS:  None.  INDICATIONS FOR THE PROCEDURE:  The patient is a 62 y.o. female who has bilateral macromastia that is clinically symptomatic.  She presents to undergo bilateral reduction mammoplasties.  DESCRIPTION OF PROCEDURE:  The patient was marked in preop holding area in a pattern of Wise for the future bilateral reduction mammoplasties. She was then taken back to the OR, placed on the table in supine position.  After adequate general anesthesia was obtained, the patient's chest was prepped with Techni-Care and draped in sterile fashion.  The bases of the breasts have been infiltrated with 1% lidocaine with epinephrine.  After adequate hemostasis and anesthesia taken effect, the procedure was begun.  Both of the breast reductions were performed in the following similar manner.  The nipple-areolar complex was marked with a 45-mm nipple marker.  The skin was then incised and deepithelialized around the nipple-areolar complex down to the inframammary crease in the inferior pedicle pattern.  Next, the medial, superior, and lateral skin flaps were elevated down to the chest wall.  Excess fat and glandular tissue removed from the inferior pedicle.  The nipple-areolar complex was examined and found to be pink and viable.  The wound was irrigated with saline irrigation.  Meticulous hemostasis was obtained with the Bovie electrocautery.  Inferior pedicle was centralized using 3-0 Prolene suture.  A #10 JP flat fully fluted drain was placed into the wound. The skin flaps were brought together at the inverted T junction with a 2- 0 Prolene suture.  The incisions were stapled for  temporary closure. The breasts compared and found to have good shape and symmetry.  The incisions were then closed from the medial aspect of the JP drain to the medial aspect of the [facility] incision by first placing a few 3-0 Monocryl sutures to tack together the dermal layer, and then both the dermal and cuticular layer were closed in a single layer using a 2-0  V-lock PDO barbed suture.  Lateral to the JP drain incision was closed using 3-0 Monocryl in the dermal layer, followed by 3-0 Monocryl running intracuticular stitch on the skin.  The vertical limb of the Wise pattern was closed in the dermal layer using 3-0 Monocryl suture.  The patient was placed in the upright position.  The future location of the nipple-areolar complexes was marked on both breast mounds using the 45-mm nipple marker.  She was then placed back in the recumbent position.  Both of the nipple areolar complexes were brought out onto the breast mounds in the following similar manner.  The skin was incised as marked and removed in full thickness into the subcutaneous tissues.  The nipple- areolar complex was examined, found to be pink and viable, then brought out through this aperture and sewn in place using 4-0 Monocryl in the dermal layer, followed by 5-0 Monocryl running intracuticular stitch on the skin.  This 5-0 Monocryl suture was then brought down to close the cuticular layer of the vertical limb as well.  The JP drain was sewn in place using 3-0 nylon suture.  The pectoralis major muscle and fascia along with the breast and chest soft tissues were then infiltrated with 1% Exparel (total 266 mg).  Now the [facility] incision was also  infiltrated with the Exparel in order to give the patient postoperative pain control.  The incisions were dressed with benzoin, Steri-Strips, and the nipples dressed with bacitracin ointment and Adaptic.  4x4s were placed over the incisions and ABD pads in the axillary areas.  The  patient was placed into a light postoperative support bra.  There were no complications. The patient tolerated the procedure well.  The final needle, sponge counts were reported to be correct at the end of the case.  The patient was then recovered without complications.  Both the patient and her family were given proper postoperative wound care instructions. She was then discharged home in the care of her family in stable condition.  Follow up will be with me in a few days in the office.         Eloise Levels, M.D.  08/16/2017 12:39 PM

## 2017-08-16 NOTE — Transfer of Care (Signed)
Immediate Anesthesia Transfer of Care Note  Patient: Sheila Wilkinson  Procedure(s) Performed: MAMMARY REDUCTION  (BREAST) BILATERAL (Bilateral Breast)  Patient Location: PACU  Anesthesia Type:General  Level of Consciousness: awake, alert , oriented and patient cooperative  Airway & Oxygen Therapy: Patient Spontanous Breathing and Patient connected to face mask oxygen  Post-op Assessment: Report given to RN and Post -op Vital signs reviewed and stable  Post vital signs: Reviewed and stable  Last Vitals:  Vitals Value Taken Time  BP    Temp    Pulse 69 08/16/2017 12:49 PM  Resp    SpO2 100 % 08/16/2017 12:49 PM  Vitals shown include unvalidated device data.  Last Pain:  Vitals:   08/16/17 0641  TempSrc: Oral  PainSc: 0-No pain         Complications: No apparent anesthesia complications

## 2017-08-16 NOTE — H&P (Signed)
  H&P faxed to surgical center.  -History and Physical Reviewed  -Patient has been re-examined  -No change in the plan of care  CONTOGIANNIS,MARY A    

## 2017-08-16 NOTE — Brief Op Note (Signed)
08/16/2017  12:42 PM  PATIENT:  Sheila Wilkinson  62 y.o. female  PRE-OPERATIVE DIAGNOSIS:  BILATERAL MACROMASTIA  POST-OPERATIVE DIAGNOSIS:  BILATERAL MACROMASTIA  PROCEDURE:  Procedure(s): MAMMARY REDUCTION  (BREAST) BILATERAL (Bilateral)  SURGEON:  Surgeon(s) and Role:    * Zavannah Deblois, Chales AbrahamsMary Ann, MD - Primary  ANESTHESIA:   general  EBL:  150 mL   BLOOD ADMINISTERED:none  DRAINS: (18F) Jackson-Pratt drain(s) with closed bulb suction in the Bilateral Breasts   LOCAL MEDICATIONS USED:  1.3% Exparel (266 mgs. Total)  SPECIMEN:  Source of Specimen:  Bilateral Breasts  DISPOSITION OF SPECIMEN:  PATHOLOGY  COUNTS:  YES  DICTATION: .Note written in EPIC  PLAN OF CARE: Discharge to home after PACU  PATIENT DISPOSITION:  PACU - hemodynamically stable.   Delay start of Pharmacological VTE agent (>24hrs) due to surgical blood loss or risk of bleeding: not applicable

## 2017-08-16 NOTE — Anesthesia Procedure Notes (Signed)
Procedure Name: Intubation Date/Time: 08/16/2017 7:36 AM Performed by: Signe Colt, CRNA Pre-anesthesia Checklist: Patient identified, Emergency Drugs available, Suction available and Patient being monitored Patient Re-evaluated:Patient Re-evaluated prior to induction Oxygen Delivery Method: Circle system utilized Preoxygenation: Pre-oxygenation with 100% oxygen Induction Type: IV induction Ventilation: Mask ventilation without difficulty Laryngoscope Size: Mac and 3 Grade View: Grade I Tube type: Oral Tube size: 7.0 mm Number of attempts: 1 Airway Equipment and Method: Stylet and Oral airway Placement Confirmation: ETT inserted through vocal cords under direct vision,  positive ETCO2 and breath sounds checked- equal and bilateral Secured at: 21 cm Tube secured with: Tape Dental Injury: Teeth and Oropharynx as per pre-operative assessment

## 2017-08-18 ENCOUNTER — Encounter (HOSPITAL_BASED_OUTPATIENT_CLINIC_OR_DEPARTMENT_OTHER): Payer: Self-pay | Admitting: Plastic Surgery

## 2018-11-30 IMAGING — CR DG CHEST 2V
2 series · 2 of 2 positions shown · non-contrast
Comparison: 02/19/2010

CLINICAL DATA: Right jaw and shoulder pain.

EXAM:
CHEST - 2 VIEW

[w chest pa]
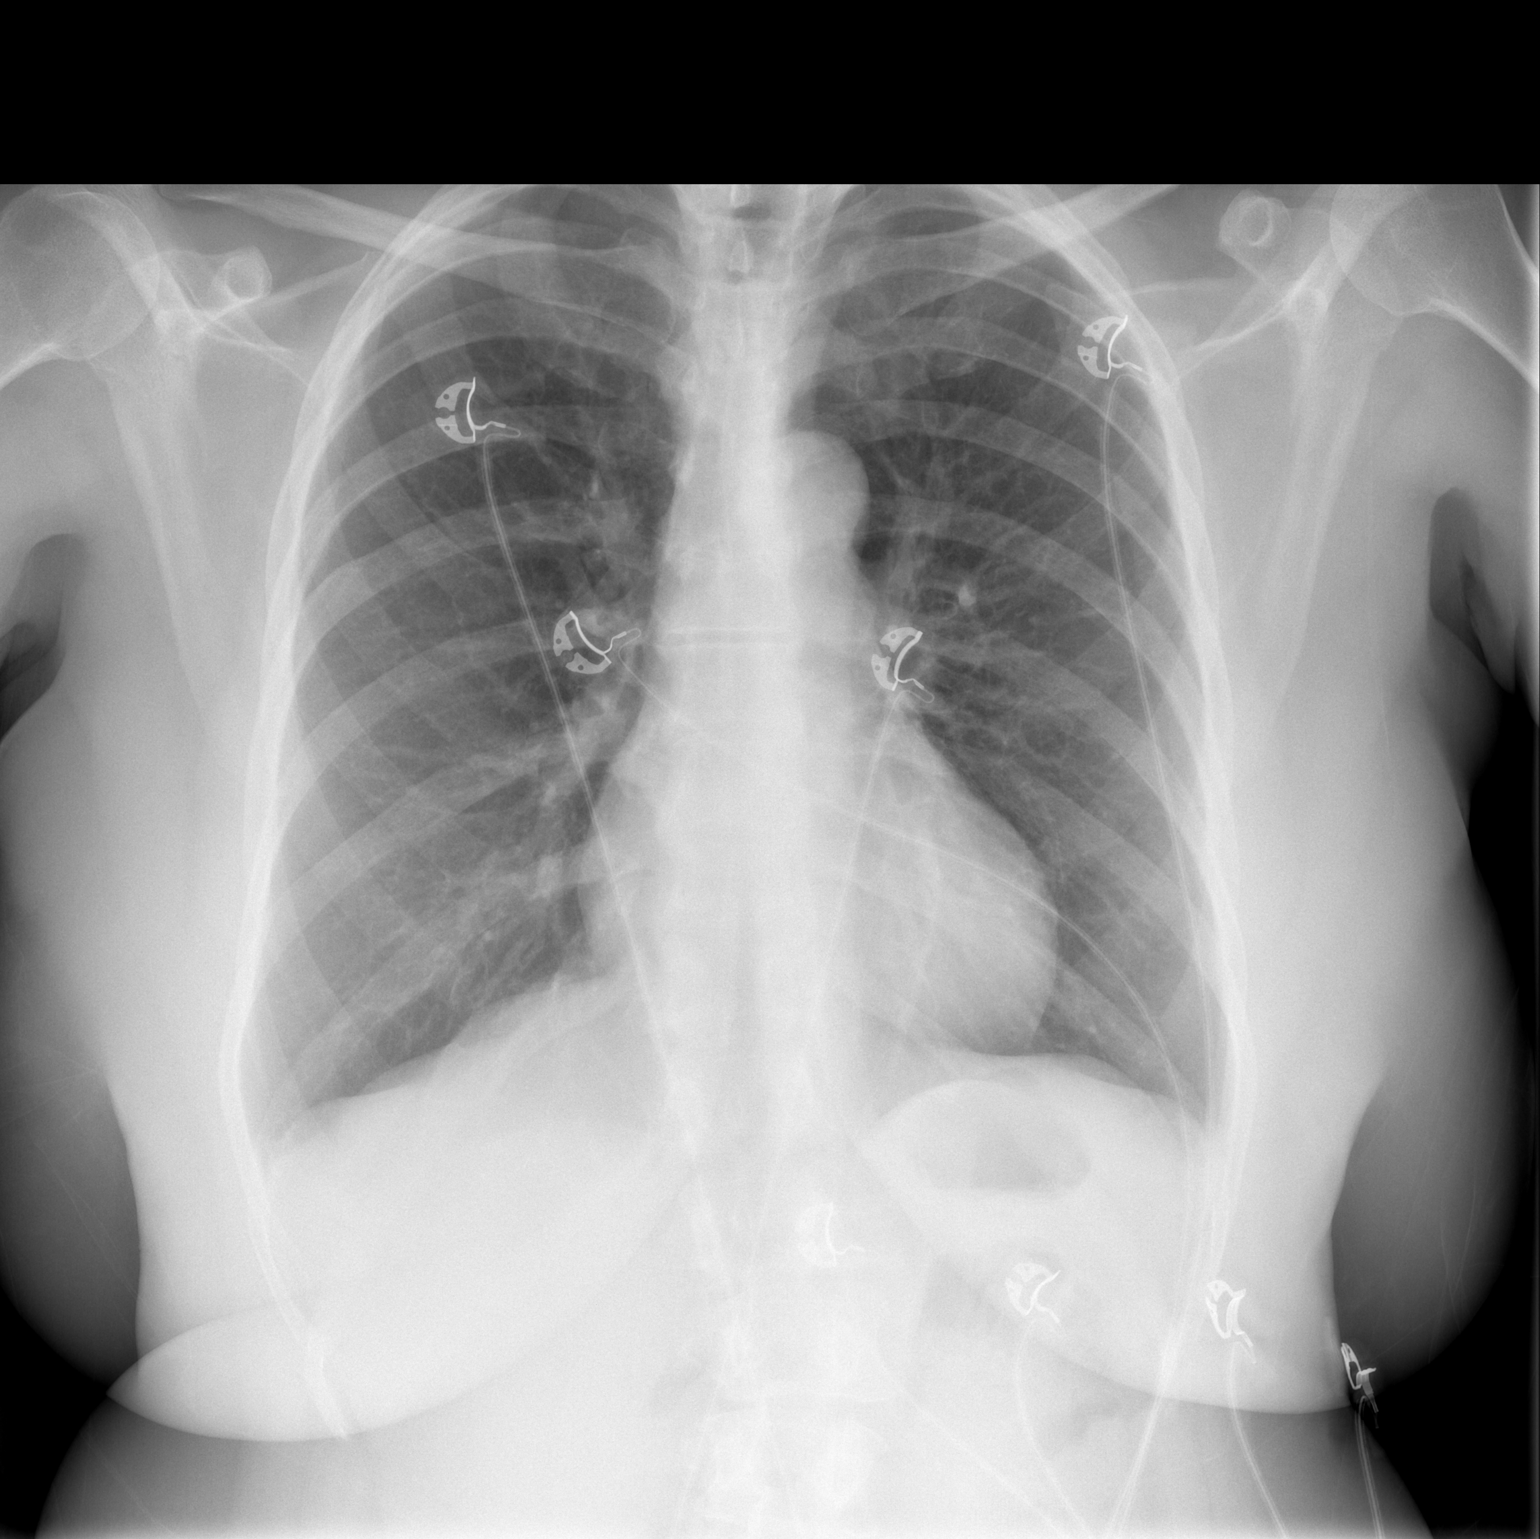

[w chest lat]
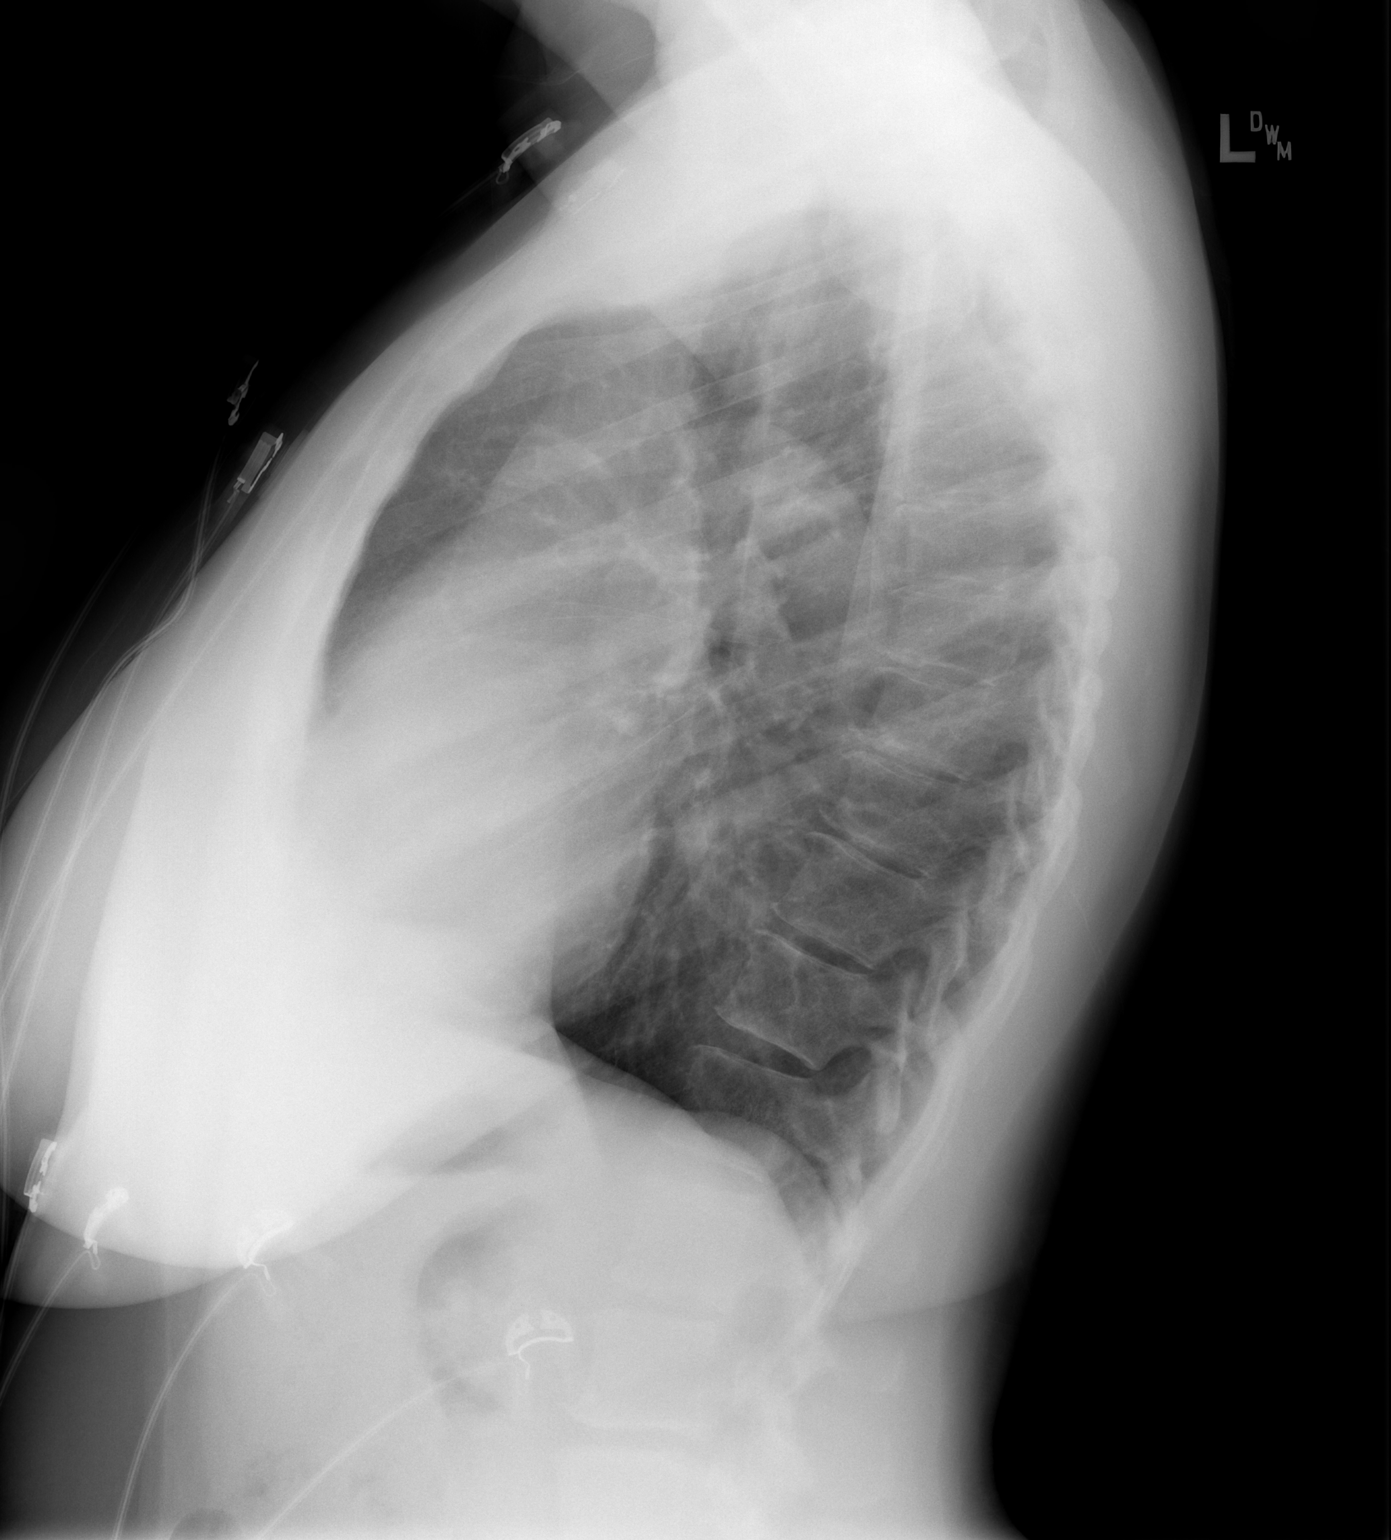

[2 of 2 positions shown; findings below may reference images not displayed]

FINDINGS: The cardiomediastinal silhouette is within normal limits. The lungs
are well inflated and clear. There is no evidence of pleural
effusion or pneumothorax. No acute osseous abnormality is
identified.
IMPRESSION: No active cardiopulmonary disease.

## 2018-12-30 ENCOUNTER — Other Ambulatory Visit: Payer: Self-pay

## 2018-12-30 DIAGNOSIS — Z20822 Contact with and (suspected) exposure to covid-19: Secondary | ICD-10-CM

## 2019-01-01 LAB — NOVEL CORONAVIRUS, NAA: SARS-CoV-2, NAA: NOT DETECTED
